# Patient Record
Sex: Male | Born: 1975 | Hispanic: Yes | Marital: Single | Smoking: Current every day smoker
Health system: Southern US, Community
[De-identification: ages and names within clinical notes are randomized; demographics above are authoritative.]

## PROBLEM LIST (undated history)

## (undated) DIAGNOSIS — E119 Type 2 diabetes mellitus without complications: Secondary | ICD-10-CM

## (undated) DIAGNOSIS — J45909 Unspecified asthma, uncomplicated: Secondary | ICD-10-CM

## (undated) DIAGNOSIS — I10 Essential (primary) hypertension: Secondary | ICD-10-CM

---

## 2004-01-01 ENCOUNTER — Other Ambulatory Visit: Payer: Self-pay

## 2020-01-14 ENCOUNTER — Emergency Department: Payer: BC Managed Care – PPO

## 2020-01-14 ENCOUNTER — Emergency Department
Admission: EM | Admit: 2020-01-14 | Discharge: 2020-01-14 | Disposition: A | Discharge status: Home / Self Care | Payer: BC Managed Care – PPO | Attending: Student | Admitting: Student

## 2020-01-14 ENCOUNTER — Other Ambulatory Visit: Payer: Self-pay

## 2020-01-14 DIAGNOSIS — E119 Type 2 diabetes mellitus without complications: Secondary | ICD-10-CM | POA: Insufficient documentation

## 2020-01-14 DIAGNOSIS — R112 Nausea with vomiting, unspecified: Secondary | ICD-10-CM | POA: Diagnosis not present

## 2020-01-14 DIAGNOSIS — R110 Nausea: Secondary | ICD-10-CM

## 2020-01-14 LAB — BASIC METABOLIC PANEL
Anion gap: 10 (ref 5–15)
BUN: 14 mg/dL (ref 6–20)
CO2: 26 mmol/L (ref 22–32)
Calcium: 9.3 mg/dL (ref 8.9–10.3)
Chloride: 96 mmol/L — ABNORMAL LOW (ref 98–111)
Creatinine, Ser: 0.65 mg/dL (ref 0.61–1.24)
GFR calc Af Amer: >60 mL/min (ref >60)
GFR calc non Af Amer: >60 mL/min (ref >60)
Glucose, Bld: 91 mg/dL (ref 70–99)
Potassium: 4.3 mmol/L (ref 3.5–5.1)
Sodium: 132 mmol/L — ABNORMAL LOW (ref 135–145)

## 2020-01-14 LAB — URINALYSIS, COMPLETE (UACMP) WITH MICROSCOPIC
Bacteria, UA: NONE SEEN
Bilirubin Urine: NEGATIVE
Glucose, UA: NEGATIVE mg/dL
Hgb urine dipstick: NEGATIVE
Ketones, ur: NEGATIVE mg/dL
Leukocytes,Ua: NEGATIVE
Nitrite: NEGATIVE
Protein, ur: 100 mg/dL — AB
Specific Gravity, Urine: 1.008 (ref 1.005–1.030)
Squamous Epithelial / LPF: NONE SEEN (ref 0–5)
pH: 6 (ref 5.0–8.0)

## 2020-01-14 LAB — CBC WITH DIFFERENTIAL/PLATELET
Abs Immature Granulocytes: 0.02 10*3/uL (ref 0.00–0.07)
Basophils Absolute: 0 10*3/uL (ref 0.0–0.1)
Basophils Relative: 1 %
Eosinophils Absolute: 0.1 10*3/uL (ref 0.0–0.5)
Eosinophils Relative: 2 %
HCT: 46.7 % (ref 39.0–52.0)
Hemoglobin: 17 g/dL (ref 13.0–17.0)
Immature Granulocytes: 0 %
Lymphocytes Relative: 24 %
Lymphs Abs: 1.4 10*3/uL (ref 0.7–4.0)
MCH: 34.3 pg — ABNORMAL HIGH (ref 26.0–34.0)
MCHC: 36.4 g/dL — ABNORMAL HIGH (ref 30.0–36.0)
MCV: 94.2 fL (ref 80.0–100.0)
Monocytes Absolute: 0.5 10*3/uL (ref 0.1–1.0)
Monocytes Relative: 8 %
Neutro Abs: 3.8 10*3/uL (ref 1.7–7.7)
Neutrophils Relative %: 65 %
Platelets: 159 10*3/uL (ref 150–400)
RBC: 4.96 MIL/uL (ref 4.22–5.81)
RDW: 11.4 % — ABNORMAL LOW (ref 11.5–15.5)
WBC: 5.8 10*3/uL (ref 4.0–10.5)
nRBC: 0 % (ref 0.0–0.2)

## 2020-01-14 LAB — LIPASE, BLOOD: Lipase: 39 U/L (ref 11–51)

## 2020-01-14 LAB — GLUCOSE, CAPILLARY: Glucose-Capillary: 95 mg/dL (ref 70–99)

## 2020-01-14 MED ORDER — ONDANSETRON 4 MG PO TBDP: 4.0000 mg | ORAL_TABLET | Freq: Three times a day (TID) | ORAL | 0 refills | Status: DC | PRN

## 2020-01-14 MED ORDER — ONDANSETRON 4 MG PO TBDP
4.0000 mg | ORAL_TABLET | Freq: Once | ORAL | Status: AC
  Administered 2020-01-14: 4 mg via ORAL
  Filled 2020-01-14: qty 1

## 2020-01-14 NOTE — ED Provider Notes (Signed)
[facility] Emergency Department Provider Note ____________________________________________  Time seen: 1707  I have reviewed the triage vital signs and the nursing notes.  HISTORY  Chief Complaint  Emesis  HPI Phillip Davies is a 44 y.o. male with a history of diet-controlled diabetes, presents to the ED from [facility]  where he initially presented with complaints of headache and nausea. Patient he and his roommate have experienced similar symptoms over the last 24 hours.  He denies any hematemesis, diarrhea, or constipation but he describes some mild upset stomach and nausea as well as a headache.  Patient describes some vomiting, but has been able to keep down water all day.  Patient also denies any fevers, chills, sweats, shortness of breath, chest pain, or dyspnea on exertion.Marland Davies  He reports that [facility] told him that his O2 saturation was low and that he should come here for further evaluation.  Patient denies any sick contacts or concern for Covid exposure at this time.  He is scheduled to get his Covid vaccine tomorrow.  History reviewed. No pertinent past medical history.  There are no problems to display for this patient.  History reviewed. No pertinent surgical history.  Prior to Admission medications   Medication Sig Start Date End Date Taking? Authorizing Provider  ondansetron (ZOFRAN ODT) 4 MG disintegrating tablet Take 1 tablet (4 mg total) by mouth every 8 (eight) hours as needed. 01/14/20   Eural Holzschuh, Charlesetta Ivory, PA-C    Allergies Patient has no allergy information on record.  No family history on file.  Social History Social History   Tobacco Use  . Smoking status: Not on file  Substance Use Topics  . Alcohol use: Not on file  . Drug use: Not on file    Review of Systems  Constitutional: Negative for fever. Eyes: Negative for visual changes. ENT: Negative for sore throat. Cardiovascular: Negative for chest pain. Respiratory:  Negative for shortness of breath. Gastrointestinal: Positive for abdominal pain, vomiting.  Denies constipation and diarrhea. Genitourinary: Negative for dysuria. Musculoskeletal: Negative for back pain. Skin: Negative for rash. Neurological: Negative for headaches, focal weakness or numbness. ____________________________________________  PHYSICAL EXAM:  VITAL SIGNS: ED Triage Vitals  Enc Vitals Group     BP 01/14/20 1602 (!) 156/97     Pulse Rate 01/14/20 1602 (!) 105     Resp 01/14/20 1602 17     Temp 01/14/20 1602 98.1 F (36.7 C)     Temp src --      SpO2 01/14/20 1602 95 %     Weight 01/14/20 1603 199 lb (90.3 kg)     Height 01/14/20 1603 5\' 6"  (1.676 m)     Head Circumference --      Peak Flow --      Pain Score 01/14/20 1603 0     Pain Loc --      Pain Edu? --      Excl. in GC? --     Constitutional: Alert and oriented. Well appearing and in no distress. Head: Normocephalic and atraumatic. Eyes: Conjunctivae are normal. Normal extraocular movements Cardiovascular: Normal rate, regular rhythm. Normal distal pulses. Respiratory: Normal respiratory effort. No wheezes/rales/rhonchi. Gastrointestinal: Soft and nontender. No distention, rebound, guarding, or rigidity.  Hyperactive bowel sounds noted.. Musculoskeletal: Nontender with normal range of motion in all extremities.  Neurologic:  Normal gait without ataxia. Normal speech and language. No gross focal neurologic deficits are appreciated. Skin:  Skin is warm, dry and intact. No rash noted. ____________________________________________  LABS (pertinent positives/negatives) Labs Reviewed  BASIC METABOLIC PANEL - Abnormal; Notable for the following components:      Result Value   Sodium 132 (*)    Chloride 96 (*)    All other components within normal limits  CBC WITH DIFFERENTIAL/PLATELET - Abnormal; Notable for the following components:   MCH 34.3 (*)    MCHC 36.4 (*)    RDW 11.4 (*)    All other components  within normal limits  URINALYSIS, COMPLETE (UACMP) WITH MICROSCOPIC - Abnormal; Notable for the following components:   Color, Urine YELLOW (*)    APPearance CLEAR (*)    Protein, ur 100 (*)    All other components within normal limits  LIPASE, BLOOD  GLUCOSE, CAPILLARY  ____________________________________________   RADIOLOGY  CXR IMPRESSION: No active cardiopulmonary disease. ____________________________________________  PROCEDURES  Zofran 4 mg ODT  Procedures ____________________________________________  INITIAL IMPRESSION / ASSESSMENT AND PLAN / ED COURSE  Patient with ED evaluation of a 24-hour complaint of crampy abdominal pain, nausea, and vomiting.  He reports similar symptoms in his roommate.  Patient presents today with somewhat improved proved symptoms and able to tolerate liquids at this time.  Labs not reveal any acute electrolyte imbalance or sepsis.  Chest x-ray is negative for any acute injury or thoracic process.  Patient will be treated symptomatically with Zofran at this time.  He is encouraged to follow-up with primary provider return to the ED as necessary.  Work is provided for 1 day as requested.  Phillip Davies was evaluated in Emergency Department on 01/14/2020 for the symptoms described in the history of present illness. He was evaluated in the context of the global COVID-19 pandemic, which necessitated consideration that the patient might be at risk for infection with the SARS-CoV-2 virus that causes COVID-19. Institutional protocols and algorithms that pertain to the evaluation of patients at risk for COVID-19 are in a state of rapid change based on information released by regulatory bodies including the CDC and federal and state organizations. These policies and algorithms were followed during the patient's care in the ED. ____________________________________________  FINAL CLINICAL IMPRESSION(S) / ED DIAGNOSES  Final diagnoses:  Nausea in adult       Phillip Needles, PA-C 01/14/20 1833    Phillip Davies., MD 01/14/20 2129

## 2020-01-14 NOTE — ED Triage Notes (Addendum)
Pt comes from [facility] with c/o SOB. Pt states he went next door for headache and vomiting.  Pt states they told him his 02 was low and sent him over here.  Pt denies any SOB.  Pt states some vomiting. Pt denies any abdominal pain. Current 02 is 95%.  Pt states he is a smoker.  Pt appears in NAD at this time. Respirations even and unlabored.

## 2020-01-14 NOTE — Discharge Instructions (Addendum)
Your exam and labs are normal at this time.  Chest x-ray does not show any pneumonia or bronchitis.  You should take the nausea medicine as needed for symptoms.  Drink plenty of fluids and eat small carbohydrate rich meals.  Follow-up with your primary provider or local urgent care for ongoing symptoms.

## 2020-04-29 ENCOUNTER — Other Ambulatory Visit (HOSPITAL_COMMUNITY): Payer: Self-pay | Admitting: Nephrology

## 2020-04-29 ENCOUNTER — Other Ambulatory Visit: Payer: Self-pay | Admitting: Nephrology

## 2020-04-29 DIAGNOSIS — R808 Other proteinuria: Secondary | ICD-10-CM

## 2020-05-07 ENCOUNTER — Ambulatory Visit: Payer: BC Managed Care – PPO

## 2020-06-04 ENCOUNTER — Ambulatory Visit: Payer: BC Managed Care – PPO | Attending: Nephrology

## 2021-06-24 ENCOUNTER — Other Ambulatory Visit: Payer: Self-pay | Admitting: Infectious Diseases

## 2021-06-24 DIAGNOSIS — R945 Abnormal results of liver function studies: Secondary | ICD-10-CM

## 2021-06-24 DIAGNOSIS — R1013 Epigastric pain: Secondary | ICD-10-CM

## 2021-06-24 DIAGNOSIS — R7989 Other specified abnormal findings of blood chemistry: Secondary | ICD-10-CM

## 2021-06-28 ENCOUNTER — Other Ambulatory Visit: Payer: Self-pay

## 2021-06-28 ENCOUNTER — Ambulatory Visit
Admission: RE | Admit: 2021-06-28 | Discharge: 2021-06-28 | Disposition: A | Discharge status: Home / Self Care | Payer: BC Managed Care – PPO | Source: Ambulatory Visit | Attending: Infectious Diseases | Admitting: Infectious Diseases

## 2021-06-28 DIAGNOSIS — R945 Abnormal results of liver function studies: Secondary | ICD-10-CM | POA: Insufficient documentation

## 2021-06-28 DIAGNOSIS — R1013 Epigastric pain: Secondary | ICD-10-CM | POA: Diagnosis present

## 2021-06-28 DIAGNOSIS — R7989 Other specified abnormal findings of blood chemistry: Secondary | ICD-10-CM

## 2021-06-28 HISTORY — DX: Unspecified asthma, uncomplicated: J45.909

## 2021-06-28 HISTORY — DX: Type 2 diabetes mellitus without complications: E11.9

## 2021-06-28 HISTORY — DX: Essential (primary) hypertension: I10

## 2021-06-28 MED ORDER — IOHEXOL 350 MG/ML SOLN
100.0000 mL | Freq: Once | INTRAVENOUS | Status: AC | PRN
  Administered 2021-06-28: 100 mL via INTRAVENOUS

## 2022-11-15 LAB — EXTERNAL GENERIC LAB PROCEDURE

## 2022-11-15 LAB — COLOGUARD

## 2023-05-30 ENCOUNTER — Other Ambulatory Visit: Payer: Self-pay | Admitting: Infectious Diseases

## 2023-05-30 DIAGNOSIS — R748 Abnormal levels of other serum enzymes: Secondary | ICD-10-CM

## 2023-05-30 DIAGNOSIS — F1010 Alcohol abuse, uncomplicated: Secondary | ICD-10-CM

## 2023-06-06 ENCOUNTER — Ambulatory Visit: Payer: BC Managed Care – PPO

## 2023-06-06 IMAGING — CT CT ABD-PELV W/ CM
2 of 5 series · 15 of 46 positions shown, 17 images · IV contrast (APPLIED)
Comparison: None

CLINICAL DATA: Epigastric pain and elevated liver function tests.

EXAM:
CT ABDOMEN AND PELVIS WITH CONTRAST
TECHNIQUE: Multidetector CT imaging of the abdomen and pelvis was performed
using the standard protocol following bolus administration of
intravenous contrast.
CONTRAST:  100mL OMNIPAQUE IOHEXOL 350 MG/ML SOLN

[Series 2: routine abd/pel with · axial · 0.76mm/px · z∈[-303,+117]mm · 12 of 96 slices shown, 14 images]
[im 6/96  soft-tissue]
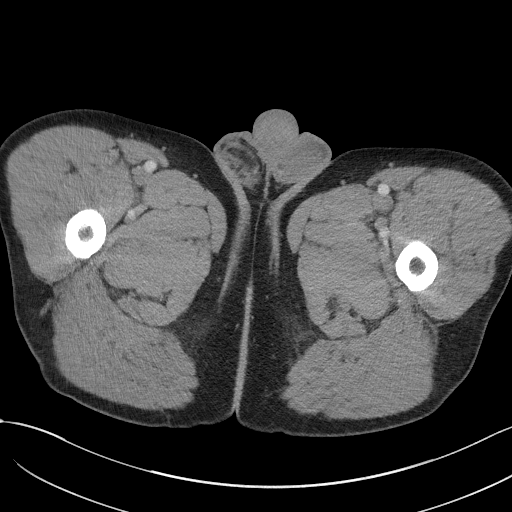
[im 6/96  bone]
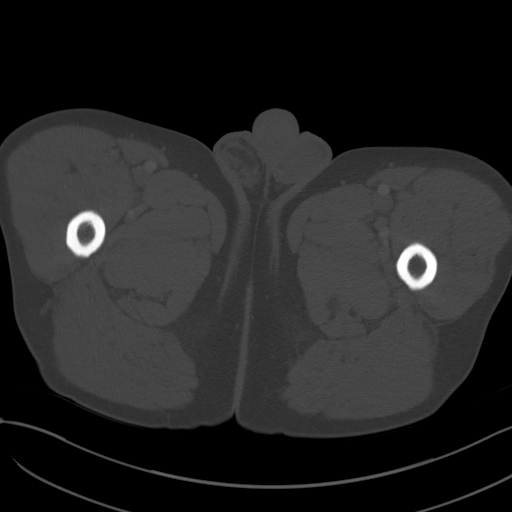
[im 16/96  soft-tissue]
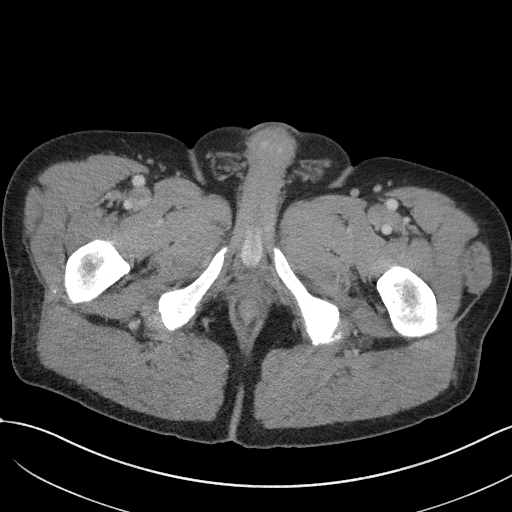
[im 22/96  soft-tissue]
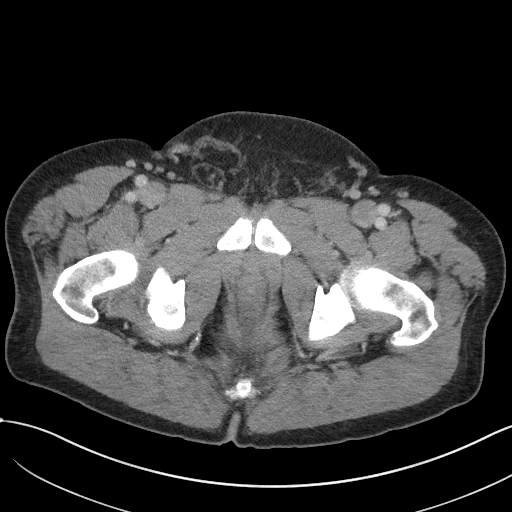
[im 27/96  soft-tissue]
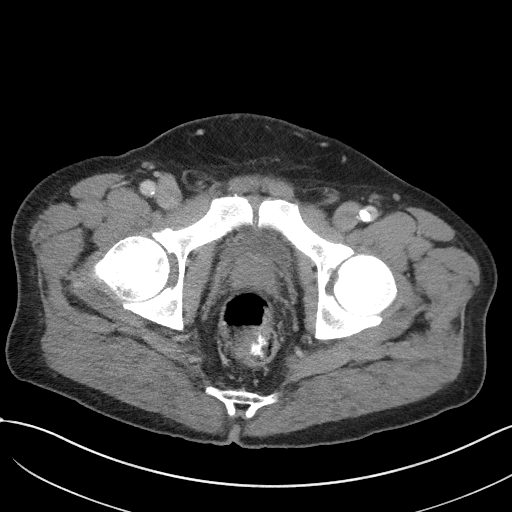
[im 37/96  soft-tissue]
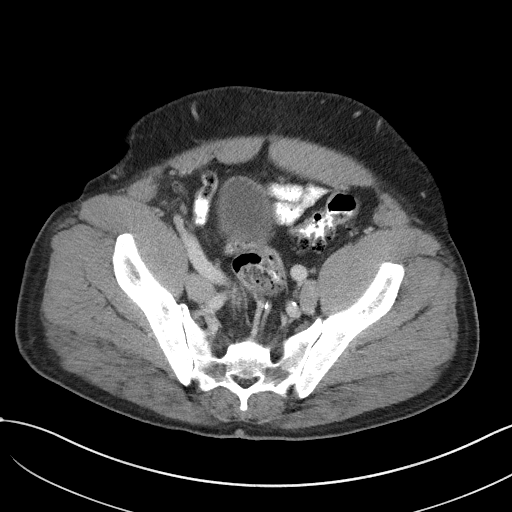
[im 43/96  soft-tissue]
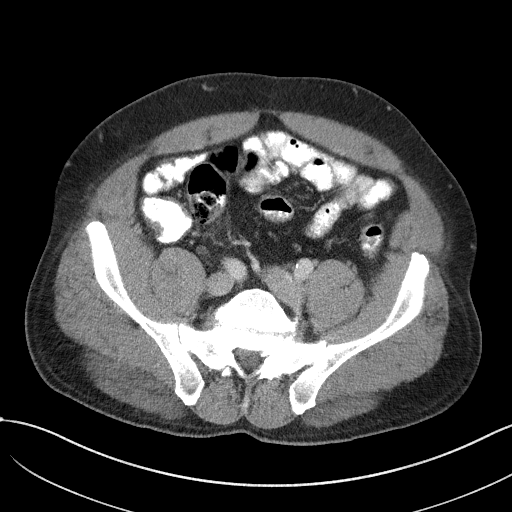
[im 53/96  soft-tissue]
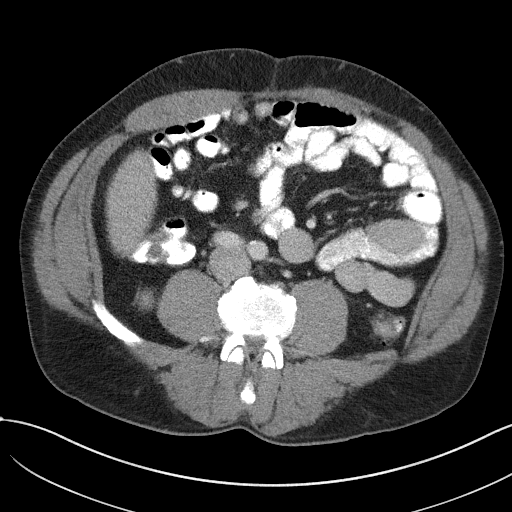
[im 59/96  soft-tissue]
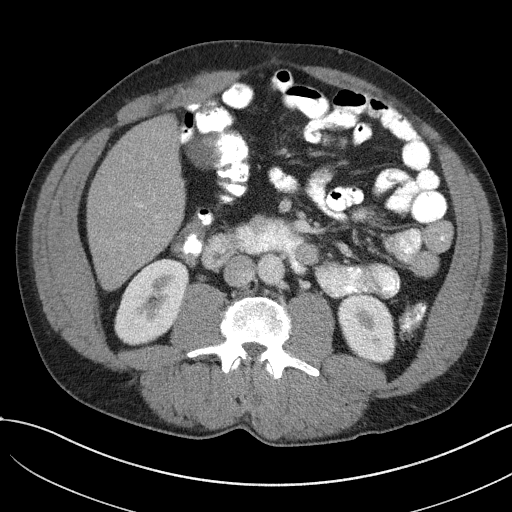
[im 69/96  soft-tissue]
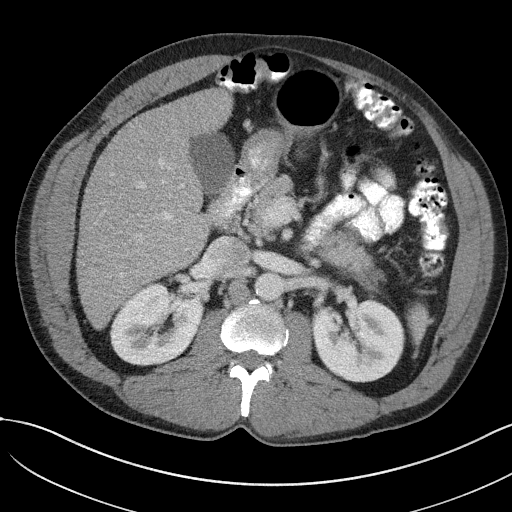
[im 69/96  bone]
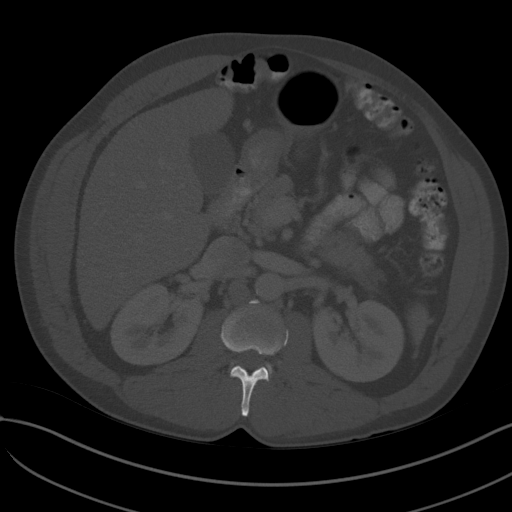
[im 74/96  soft-tissue]
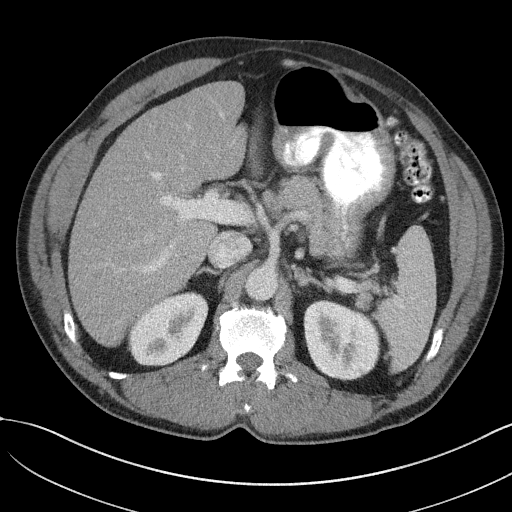
[im 80/96  soft-tissue]
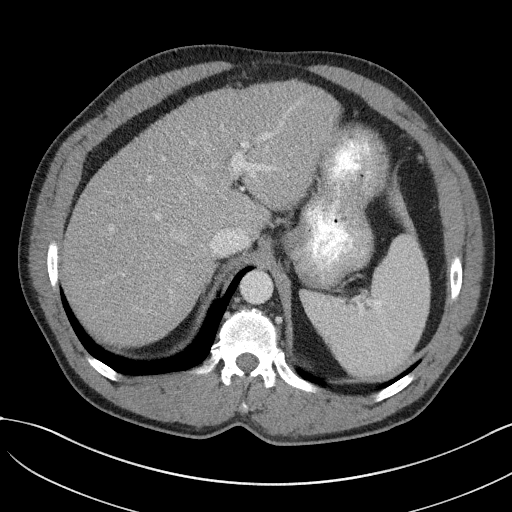
[im 90/96  soft-tissue]
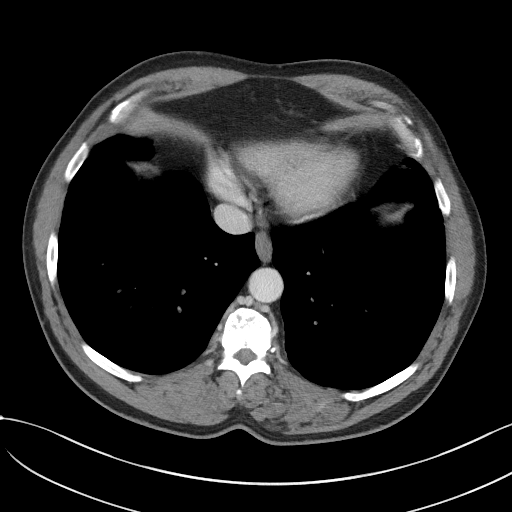

[Series 5: coronal st · coronal · 0.82mm/px · 3 of 110 slices shown]
[im 37/110  soft-tissue]
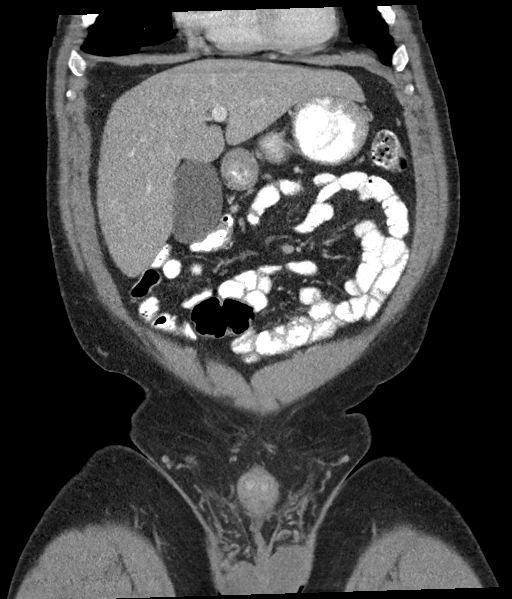
[im 49/110  soft-tissue]
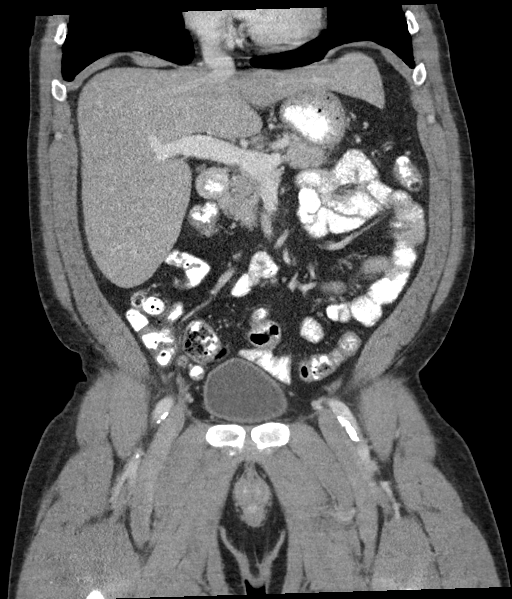
[im 61/110  soft-tissue]
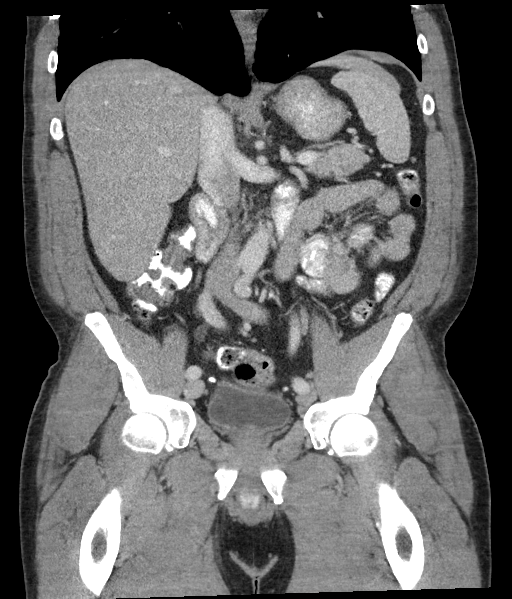

[15 of 46 positions shown; findings below may reference images not displayed]

FINDINGS: Lower chest: Linear subsegmental atelectasis or scarring medially in
the left lower lobe, image 11 series 4.

Hepatobiliary: Equivocal lobularity of the anterior margin left
hepatic lobe no definite morphologic indicators of cirrhosis.
Gallbladder unremarkable. No biliary dilatation.

Pancreas: Subtle fluid density stranding along the inferior margin
of the pancreatic tail on image 28 series 2 and image 66 series 5,
correlate with lipase levels in assessing for mild pancreatitis.
Otherwise unremarkable.

Spleen: Unremarkable

Adrenals/Urinary Tract: Unremarkable

Stomach/Bowel: Descending and sigmoid colon diverticulosis without
findings of active diverticulitis. Normal appendix. No dilated
bowel.

Vascular/Lymphatic: Atherosclerosis is present, including aortoiliac
atherosclerotic disease. The overall burden of atherosclerotic
calcification is low. No compelling vascular findings of portal
venous hypertension.

Reproductive: Unremarkable

Other: No supplemental non-categorized findings.

Musculoskeletal: Old healed medial fractures of the right tenth and
eleventh ribs near the costovertebral junction.

Chronic bilateral pars interarticularis defects at L5 with 0.9 cm
anterolisthesis of L5 on S1 contributing to left greater than right
foraminal impingement at the L5-S1 level. There is likely some sub
ligamentous disc material herniating posterior to the L5 vertebral
body. There is also 6 mm of degenerative retrolisthesis at L4-5 with
facet arthropathy contributing to mild right foraminal impingement
at the L4-5 level. Mild degenerative disc disease at L3-4.

Small direct left inguinal hernia contains adipose tissue.
IMPRESSION: 1. Trace infiltrative fluid density along the inferior margin of the
pancreatic tail, correlate with lipase levels in assessing for
active or resolving pancreatitis. No pseudocyst, abscess, or
pancreatic necrosis.
2. Equivocal lobularity of the anterior margin of the left hepatic
lobe is observed but this is in the absence of other morphologic
indicators of early cirrhosis and overall may be incidental.
3. Chronic pars defects at L5 with grade 2 anterolisthesis of L5 on
S1. There is also degenerative retrolisthesis at L4-5. Foraminal
impingement at both L4-5 and L5-S1.
4.  Aortic Atherosclerosis ([id number]).
5. Descending and sigmoid colon diverticulosis without active
diverticulitis.
6. Small direct left inguinal hernia containing adipose tissue.

## 2024-01-01 LAB — EXTERNAL GENERIC LAB PROCEDURE: COLOGUARD: NEGATIVE

## 2024-01-01 LAB — COLOGUARD: COLOGUARD: NEGATIVE

## 2024-01-31 ENCOUNTER — Other Ambulatory Visit: Payer: Self-pay

## 2024-01-31 ENCOUNTER — Emergency Department
Admission: EM | Admit: 2024-01-31 | Discharge: 2024-01-31 | Disposition: A | Discharge status: Home / Self Care | Attending: Emergency Medicine | Admitting: Emergency Medicine

## 2024-01-31 ENCOUNTER — Encounter: Payer: Self-pay | Admitting: Emergency Medicine

## 2024-01-31 ENCOUNTER — Emergency Department

## 2024-01-31 DIAGNOSIS — E119 Type 2 diabetes mellitus without complications: Secondary | ICD-10-CM | POA: Insufficient documentation

## 2024-01-31 DIAGNOSIS — I10 Essential (primary) hypertension: Secondary | ICD-10-CM | POA: Insufficient documentation

## 2024-01-31 DIAGNOSIS — R718 Other abnormality of red blood cells: Secondary | ICD-10-CM | POA: Diagnosis not present

## 2024-01-31 DIAGNOSIS — K859 Acute pancreatitis without necrosis or infection, unspecified: Secondary | ICD-10-CM | POA: Insufficient documentation

## 2024-01-31 DIAGNOSIS — J45909 Unspecified asthma, uncomplicated: Secondary | ICD-10-CM | POA: Diagnosis not present

## 2024-01-31 DIAGNOSIS — R109 Unspecified abdominal pain: Secondary | ICD-10-CM | POA: Diagnosis present

## 2024-01-31 LAB — URINALYSIS, ROUTINE W REFLEX MICROSCOPIC
Bacteria, UA: NONE SEEN
Bilirubin Urine: NEGATIVE
Glucose, UA: >=500 mg/dL — AB
Ketones, ur: NEGATIVE mg/dL
Leukocytes,Ua: NEGATIVE
Nitrite: NEGATIVE
Protein, ur: 100 mg/dL — AB
Specific Gravity, Urine: 1.024 (ref 1.005–1.030)
Squamous Epithelial / HPF: 0 /HPF (ref 0–5)
WBC, UA: 0 WBC/hpf (ref 0–5)
pH: 7 (ref 5.0–8.0)

## 2024-01-31 LAB — COMPREHENSIVE METABOLIC PANEL WITH GFR
ALT: 54 U/L — ABNORMAL HIGH (ref 0–44)
AST: 77 U/L — ABNORMAL HIGH (ref 15–41)
Albumin: 4.3 g/dL (ref 3.5–5.0)
Alkaline Phosphatase: 81 U/L (ref 38–126)
Anion gap: 10 (ref 5–15)
BUN: 11 mg/dL (ref 6–20)
CO2: 25 mmol/L (ref 22–32)
Calcium: 9.3 mg/dL (ref 8.9–10.3)
Chloride: 93 mmol/L — ABNORMAL LOW (ref 98–111)
Creatinine, Ser: 0.64 mg/dL (ref 0.61–1.24)
GFR, Estimated: >60 mL/min (ref >60)
Glucose, Bld: 149 mg/dL — ABNORMAL HIGH (ref 70–99)
Potassium: 3.8 mmol/L (ref 3.5–5.1)
Sodium: 128 mmol/L — ABNORMAL LOW (ref 135–145)
Total Bilirubin: 1.6 mg/dL — ABNORMAL HIGH (ref 0.0–1.2)
Total Protein: 8.3 g/dL — ABNORMAL HIGH (ref 6.5–8.1)

## 2024-01-31 LAB — LIPASE, BLOOD: Lipase: 334 U/L — ABNORMAL HIGH (ref 11–51)

## 2024-01-31 LAB — CBC
HCT: 50.2 % (ref 39.0–52.0)
Hemoglobin: 18.2 g/dL — ABNORMAL HIGH (ref 13.0–17.0)
MCH: 34.9 pg — ABNORMAL HIGH (ref 26.0–34.0)
MCHC: 36.3 g/dL — ABNORMAL HIGH (ref 30.0–36.0)
MCV: 96.2 fL (ref 80.0–100.0)
Platelets: 137 10*3/uL — ABNORMAL LOW (ref 150–400)
RBC: 5.22 MIL/uL (ref 4.22–5.81)
RDW: 11 % — ABNORMAL LOW (ref 11.5–15.5)
WBC: 9.8 10*3/uL (ref 4.0–10.5)
nRBC: 0 % (ref 0.0–0.2)

## 2024-01-31 MED ORDER — ONDANSETRON 4 MG PO TBDP: 4.0000 mg | ORAL_TABLET | Freq: Three times a day (TID) | ORAL | 0 refills | Status: AC | PRN

## 2024-01-31 MED ORDER — SODIUM CHLORIDE 0.9 % IV BOLUS
1000.0000 mL | Freq: Once | INTRAVENOUS | Status: AC
  Administered 2024-01-31: 1000 mL via INTRAVENOUS

## 2024-01-31 MED ORDER — ALUM & MAG HYDROXIDE-SIMETH 200-200-20 MG/5ML PO SUSP
30.0000 mL | Freq: Once | ORAL | Status: AC
  Administered 2024-01-31: 30 mL via ORAL
  Filled 2024-01-31: qty 30

## 2024-01-31 MED ORDER — LIDOCAINE VISCOUS HCL 2 % MT SOLN
15.0000 mL | Freq: Once | OROMUCOSAL | Status: AC
  Administered 2024-01-31: 15 mL via ORAL
  Filled 2024-01-31: qty 15

## 2024-01-31 MED ORDER — HYDROCODONE-ACETAMINOPHEN 5-325 MG PO TABS: 1.0000 | ORAL_TABLET | ORAL | 0 refills | Status: AC | PRN

## 2024-01-31 MED ORDER — IOHEXOL 300 MG/ML  SOLN
100.0000 mL | Freq: Once | INTRAMUSCULAR | Status: AC | PRN
Start: 2024-01-31 — End: 2024-01-31
  Administered 2024-01-31: 100 mL via INTRAVENOUS

## 2024-01-31 NOTE — ED Notes (Signed)
 Lt and Lav top sent to the lab

## 2024-01-31 NOTE — ED Triage Notes (Signed)
 Pt via POV from home. Pt c/o intermittent generalized abd pain since Tuesday night. Report some nausea medicine this morning and nausea is better. Denies pain now. Pt is A&OX4 and NAD, ambulatory to triage.

## 2024-01-31 NOTE — Discharge Instructions (Addendum)
 The blood work and CT scan showed that you have pancreatitis today.  This often resolves on its own with time.  Please follow the diet plan attached.  I also recommend avoiding alcohol.  You can take the Zofran  every 8 hours as needed for nausea.  The Norco can be taken every 4 hours as needed for severe pain.  You can take 600 mg of ibuprofen every 6 hours as needed for mild to moderate pain.  Please return to the emergency department with any worsening symptoms like worsening pain, fever, abdominal bruising or uncontrolled vomiting.

## 2024-01-31 NOTE — ED Provider Notes (Signed)
 [facility] Provider Note    Event Date/Time   First MD Initiated Contact with Patient 01/31/24 1015     (approximate)   History   Abdominal Pain and Nausea   HPI  Phillip Davies is a 48 y.o. male with PMH of hypertension, asthma and diabetes who presents for evaluation of abdominal pain with nausea.  Patient states that the pain began on Tuesday evening.  He reports Tuesday during the day he ate some eggs that may have been sitting out in the sun and thinks this might be the cause of his pain.  He reports he has had some nausea but no vomiting.  Denies diarrhea and constipation.  Describes the pain as intermittent with bouts of severe pain.  Feels like his intestines are cramping.  Denies pain at this time.      Physical Exam   Triage Vital Signs: ED Triage Vitals  Encounter Vitals Group     BP 01/31/24 0943 130/79     Systolic BP Percentile --      Diastolic BP Percentile --      Pulse Rate 01/31/24 0943 99     Resp 01/31/24 0943 19     Temp 01/31/24 0943 98 F (36.7 C)     Temp Source 01/31/24 0943 Oral     SpO2 01/31/24 0943 95 %     Weight 01/31/24 0949 191 lb (86.6 kg)     Height 01/31/24 0949 5\' 5"  (1.651 m)     Head Circumference --      Peak Flow --      Pain Score 01/31/24 0949 0     Pain Loc --      Pain Education --      Exclude from Growth Chart --     Most recent vital signs: Vitals:   01/31/24 0943  BP: 130/79  Pulse: 99  Resp: 19  Temp: 98 F (36.7 C)  SpO2: 95%   General: Awake, no distress.  CV:  Good peripheral perfusion. RRR. Resp:  Normal effort.  CTAB. Abd:  No distention.  Soft with generalized tenderness to palpation, bowel sounds appropriate.   ED Results / Procedures / Treatments   Labs (all labs ordered are listed, but only abnormal results are displayed) Labs Reviewed  LIPASE, BLOOD - Abnormal; Notable for the following components:      Result Value   Lipase 334 (*)    All other  components within normal limits  COMPREHENSIVE METABOLIC PANEL WITH GFR - Abnormal; Notable for the following components:   Sodium 128 (*)    Chloride 93 (*)    Glucose, Bld 149 (*)    Total Protein 8.3 (*)    AST 77 (*)    ALT 54 (*)    Total Bilirubin 1.6 (*)    All other components within normal limits  CBC - Abnormal; Notable for the following components:   Hemoglobin 18.2 (*)    MCH 34.9 (*)    MCHC 36.3 (*)    RDW 11.0 (*)    Platelets 137 (*)    All other components within normal limits  URINALYSIS, ROUTINE W REFLEX MICROSCOPIC - Abnormal; Notable for the following components:   Color, Urine YELLOW (*)    APPearance CLEAR (*)    Glucose, UA >=500 (*)    Hgb urine dipstick SMALL (*)    Protein, ur 100 (*)    All other components within normal limits    RADIOLOGY  CT abdomen pelvis obtained, interpreted the images as well as reviewed the radiologist report.  Images show acute interstitial pancreatitis of the pancreatic tail without abscess or pseudocyst.  There is also sigmoid diverticulosis, fatty liver and aortic atherosclerosis.   PROCEDURES:  Critical Care performed: No  Procedures   MEDICATIONS ORDERED IN ED: Medications  sodium chloride  0.9 % bolus 1,000 mL (1,000 mLs Intravenous New Bag/Given 01/31/24 1108)  alum & mag hydroxide-simeth (MAALOX/MYLANTA) 200-200-20 MG/5ML suspension 30 mL (30 mLs Oral Given 01/31/24 1107)    And  lidocaine  (XYLOCAINE ) 2 % viscous mouth solution 15 mL (15 mLs Oral Given 01/31/24 1107)  iohexol  (OMNIPAQUE ) 300 MG/ML solution 100 mL (100 mLs Intravenous Contrast Given 01/31/24 1101)     IMPRESSION / MDM / ASSESSMENT AND PLAN / ED COURSE  I reviewed the triage vital signs and the nursing notes.                             48 year old male presents for evaluation of abdominal pain.  Vital signs are stable patient NAD on exam.  Differential diagnosis includes, but is not limited to,   Differential diagnosis includes, but is not  limited to, acute appendicitis, renal colic, acute cholecystitis, pancreatitis, urinary tract infection/pyelonephritis,  diverticulitis, small bowel obstruction or ileus, colitis, gastroenteritis, hernia, etc.  Patient's presentation is most consistent with acute complicated illness / injury requiring diagnostic workup.  CBC shows elevated hemoglobin, CMP shows hyponatremia hypochloremia, elevated glucose, elevated protein, elevated AST and ALT and elevated total bilirubin.  Lipase is elevated.  UA shows presence of protein and glucose but is otherwise normal.  Will start some IV fluids as I suspect patient's elevated hemoglobin and hyponatremia is secondary to dehydration.  Hemoglobin may also be elevated due to patient taking Jardiance. Will also proceed with getting CT scan to further evaluate potential abdominal pathologies given elevated lipase and liver enzymes.  CT scan shows acute interstitial pancreatitis of the pancreatic tail without abscess or pseudocyst.  Patient's pain was well-controlled in the emergency department so.  He is tolerating p.o.  Given that there are no systemic complications I feel that he could be managed as an outpatient.  I did offer admission but patient would prefer to go home.  I gave him a prescription for nausea and pain medication.  He was given a note for work.  We discussed avoiding alcohol and specific dietary guidelines for pancreatitis.  He voiced understanding, all questions were answered and he was stable at discharge.      FINAL CLINICAL IMPRESSION(S) / ED DIAGNOSES   Final diagnoses:  Acute pancreatitis without infection or necrosis, unspecified pancreatitis type     Rx / DC Orders   ED Discharge Orders          Ordered    ondansetron  (ZOFRAN -ODT) 4 MG disintegrating tablet  Every 8 hours PRN        01/31/24 1025    HYDROcodone -acetaminophen  (NORCO/VICODIN) 5-325 MG tablet  Every 4 hours PRN        01/31/24 1315             Note:   This document was prepared using Dragon voice recognition software and may include unintentional dictation errors.   Phyliss Breen, PA-C 01/31/24 1321    Twilla Galea, MD 01/31/24 [phone]

## 2024-01-31 NOTE — ED Notes (Signed)
 See triage note  Presents with some abd pain with nausea  States pain started on Tuesday and has been intermittent Afebrile on arrival

## 2024-10-31 ENCOUNTER — Other Ambulatory Visit: Payer: Self-pay | Admitting: Emergency Medicine

## 2024-10-31 DIAGNOSIS — Z122 Encounter for screening for malignant neoplasm of respiratory organs: Secondary | ICD-10-CM

## 2024-10-31 DIAGNOSIS — F17210 Nicotine dependence, cigarettes, uncomplicated: Secondary | ICD-10-CM

## 2024-11-11 ENCOUNTER — Other Ambulatory Visit: Payer: Self-pay | Admitting: Infectious Diseases

## 2024-11-11 DIAGNOSIS — J9611 Chronic respiratory failure with hypoxia: Secondary | ICD-10-CM

## 2024-11-19 ENCOUNTER — Ambulatory Visit

## 2024-12-05 ENCOUNTER — Ambulatory Visit
